# Patient Record
Sex: Female | Born: 2000 | Race: Black or African American | Hispanic: No | Marital: Single | Smoking: Never smoker
Health system: Southern US, Community
[De-identification: ages and names within clinical notes are randomized; demographics above are authoritative.]

---
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
## 2022-01-15 IMAGING — DX DG CHEST 1V PORT
1 series · 1 of 1 positions shown · non-contrast
Comparison: None.

CLINICAL DATA: Chest pain which began at swimming practice, mid
chest pain radiating to left shoulder are

EXAM:
PORTABLE CHEST 1 VIEW

[chest]
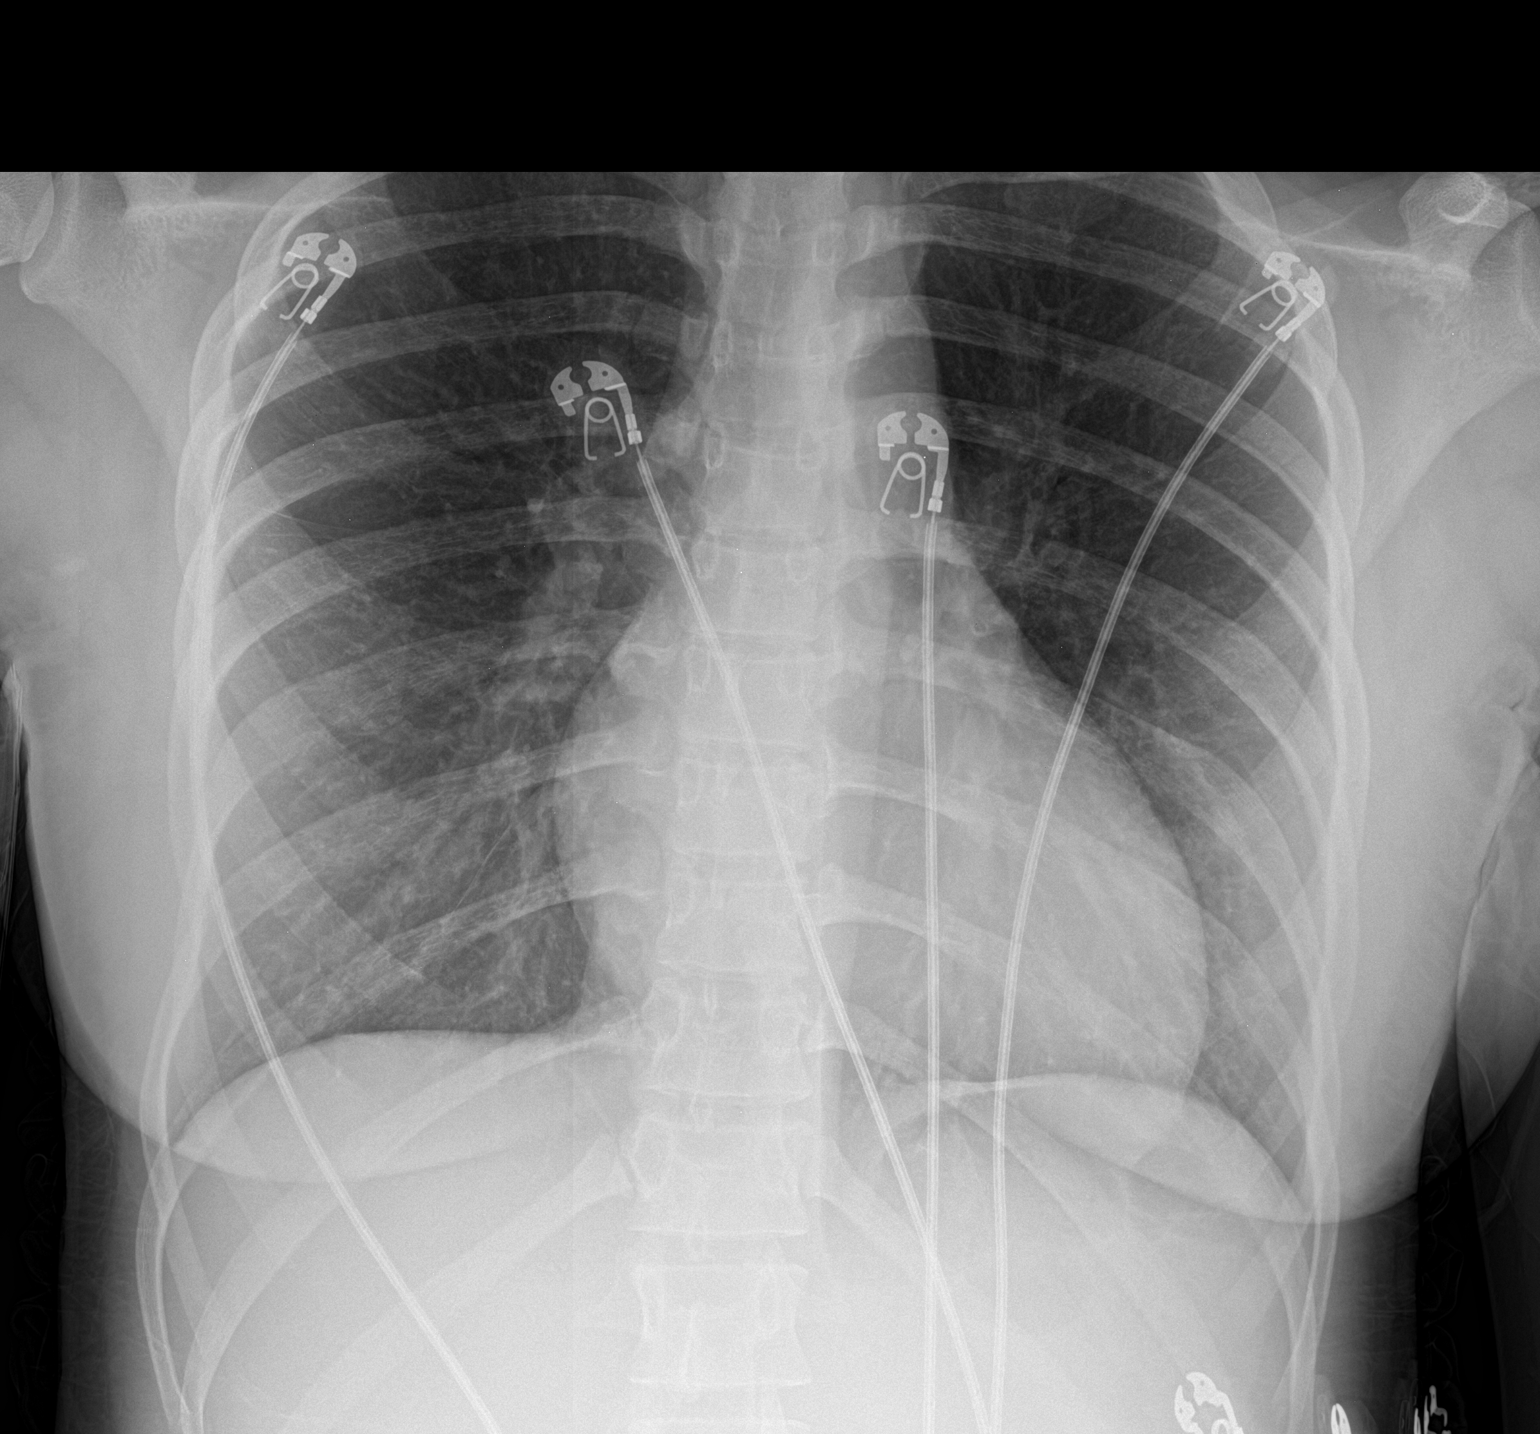

[1 of 1 positions shown; findings below may reference images not displayed]

FINDINGS: No consolidation, features of edema, pneumothorax, or effusion.
Pulmonary vascularity is normally distributed. The cardiomediastinal
contours are unremarkable. No acute osseous or soft tissue
abnormality.
IMPRESSION: No acute cardiopulmonary abnormality.

## 2022-01-31 ENCOUNTER — Encounter: Payer: Self-pay | Admitting: Student

## 2022-03-15 ENCOUNTER — Encounter (HOSPITAL_COMMUNITY): Payer: Self-pay

## 2022-03-15 ENCOUNTER — Ambulatory Visit (HOSPITAL_COMMUNITY)
Admission: RE | Admit: 2022-03-15 | Discharge: 2022-03-15 | Discharge status: Absent without leave | Payer: Commercial Managed Care - HMO | Source: Ambulatory Visit

## 2022-03-15 ENCOUNTER — Ambulatory Visit (HOSPITAL_COMMUNITY)
Admission: EM | Admit: 2022-03-15 | Discharge: 2022-03-15 | Disposition: A | Discharge status: Home / Self Care | Payer: Medicaid Other | Attending: Emergency Medicine | Admitting: Emergency Medicine

## 2022-03-15 DIAGNOSIS — N898 Other specified noninflammatory disorders of vagina: Secondary | ICD-10-CM

## 2022-03-15 DIAGNOSIS — Z113 Encounter for screening for infections with a predominantly sexual mode of transmission: Secondary | ICD-10-CM | POA: Diagnosis not present

## 2022-03-15 NOTE — ED Provider Notes (Signed)
[place]    CSN: [id number] Arrival date & time: 03/15/22  1630     History   Chief Complaint Chief Complaint  Patient presents with   Vaginal Itching    HPI Eliese Shoji is a 21 y.o. female.  Presents with 4-day history of vaginal itching and irritation Denies any change in discharge color or odor No known exposures to STDs Denies rash   Denies urinary symptoms.  No abdominal pain She does not have an OB/GYN  History reviewed. No pertinent past medical history.  Patient Active Problem List   Diagnosis Date Noted   Upper back pain on left side 08/24/2020   Right shoulder pain 10/12/2018   Encounter for oral contraception initial prescription 06/28/2018   Severe menstrual cramps 06/28/2018    History reviewed. No pertinent surgical history.  OB History   No obstetric history on file.      Home Medications    Prior to Admission medications   Medication Sig Start Date End Date Taking? Authorizing Provider  cyclobenzaprine (FLEXERIL) 5 MG tablet Take 1 tablet (5 mg total) by mouth every 8 (eight) hours as needed for muscle spasms. 08/16/20   Alfonso Ellis, MD  ibuprofen (ADVIL) 600 MG tablet Take 1 tablet (600 mg total) by mouth every 8 (eight) hours as needed. Patient not taking: Reported on 07/04/2020 12/20/19   Darr, Edison Nasuti, PA-C  naproxen (NAPROSYN) 500 MG tablet Take 1 tablet (500 mg total) by mouth 2 (two) times daily with a meal. 12/22/19   Meccariello, Bernita Raisin, MD  norgestimate-ethinyl estradiol (Arvin 28) 0.25-35 MG-MCG tablet Take 1 tablet by mouth daily. 07/11/21   Orvis Brill, DO    Family History Family History  Problem Relation Age of Onset   Depression Mother 69       Taking medications    Sickle cell trait Maternal Grandfather     Social History Social History   Tobacco Use   Smoking status: Never   Smokeless tobacco: Never  Vaping Use   Vaping Use: Never used  Substance Use Topics   Alcohol use: Never    Drug use: Never     Allergies   Patient has no known allergies.   Review of Systems Review of Systems Per HPI  Physical Exam Triage Vital Signs ED Triage Vitals [03/15/22 1758]  Enc Vitals Group     BP (!) 149/91     Pulse Rate 79     Resp 12     Temp 98.1 F (36.7 C)     Temp Source Oral     SpO2 100 %     Weight      Height      Head Circumference      Peak Flow      Pain Score 0     Pain Loc      Pain Edu?      Excl. in [place]?    No data found.  Updated Vital Signs BP (!) 149/91 (BP Location: Left Arm)   Pulse 79   Temp 98.1 F (36.7 C) (Oral)   Resp 12   SpO2 100%   Physical Exam Vitals and nursing note reviewed.  Constitutional:      General: She is not in acute distress.    Appearance: Normal appearance.  HENT:     Mouth/Throat:     Pharynx: Oropharynx is clear.  Cardiovascular:     Rate and Rhythm: Normal rate and regular rhythm.  Pulses: Normal pulses.  Pulmonary:     Effort: Pulmonary effort is normal.  Neurological:     Mental Status: She is alert and oriented to person, place, and time.     UC Treatments / Results  Labs (all labs ordered are listed, but only abnormal results are displayed) Labs Reviewed  CERVICOVAGINAL ANCILLARY ONLY    EKG  Radiology No results found.  Procedures Procedures   Medications Ordered in UC Medications - No data to display  Initial Impression / Assessment and Plan / UC Course  I have reviewed the triage vital signs and the nursing notes.  Pertinent labs & imaging results that were available during my care of the patient were reviewed by me and considered in my medical decision making (see chart for details).  Cytology swab pending. Provided women's clinic contact information for follow-up as needed. No other questions or concerns today. Return precautions discussed. Patient agrees to plan  Final Clinical Impressions(s) / UC Diagnoses   Final diagnoses:  Vaginal itching  Screening  examination for STD (sexually transmitted disease)     Discharge Instructions      We will call you if anything on your swab returns positive. Please abstain from sexual intercourse until your results return.  I recommend establish with a women's clinic for further evaluation.     ED Prescriptions   None    PDMP not reviewed this encounter.   Kathrine Haddock 03/15/22 1832

## 2022-03-15 NOTE — Discharge Instructions (Signed)
We will call you if anything on your swab returns positive. Please abstain from sexual intercourse until your results return.  I recommend establish with a women's clinic for further evaluation.

## 2022-03-15 NOTE — ED Triage Notes (Signed)
Pt is here for vaginal irritation x4days

## 2022-03-18 LAB — CERVICOVAGINAL ANCILLARY ONLY
Bacterial Vaginitis (gardnerella): NEGATIVE
Candida Glabrata: NEGATIVE
Candida Vaginitis: POSITIVE — AB
Chlamydia: NEGATIVE
Comment: NEGATIVE
Comment: NEGATIVE
Comment: NEGATIVE
Comment: NEGATIVE
Comment: NEGATIVE
Comment: NORMAL
Neisseria Gonorrhea: NEGATIVE
Trichomonas: NEGATIVE

## 2022-03-19 ENCOUNTER — Telehealth (HOSPITAL_COMMUNITY): Payer: Self-pay | Admitting: Emergency Medicine

## 2022-03-19 MED ORDER — FLUCONAZOLE 150 MG PO TABS: 150.0000 mg | ORAL_TABLET | Freq: Once | ORAL | 0 refills | Status: AC

## 2022-06-18 ENCOUNTER — Other Ambulatory Visit: Payer: Self-pay | Admitting: Student

## 2022-06-26 ENCOUNTER — Telehealth: Payer: Self-pay

## 2022-06-26 MED ORDER — NORGESTIMATE-ETH ESTRADIOL 0.25-35 MG-MCG PO TABS: 1.0000 | ORAL_TABLET | Freq: Every day | ORAL | 11 refills | Status: DC

## 2022-06-26 NOTE — Telephone Encounter (Signed)
Patient calls nurse line in regards to birth control refill.   Medication was sent in, however was set to print.   I have resent this.

## 2022-06-28 ENCOUNTER — Encounter (HOSPITAL_BASED_OUTPATIENT_CLINIC_OR_DEPARTMENT_OTHER): Payer: Self-pay

## 2022-06-28 ENCOUNTER — Other Ambulatory Visit: Payer: Self-pay

## 2022-06-28 ENCOUNTER — Emergency Department (HOSPITAL_BASED_OUTPATIENT_CLINIC_OR_DEPARTMENT_OTHER)
Admission: EM | Admit: 2022-06-28 | Discharge: 2022-06-28 | Disposition: A | Discharge status: Home / Self Care | Payer: Commercial Managed Care - HMO | Attending: Emergency Medicine | Admitting: Emergency Medicine

## 2022-06-28 DIAGNOSIS — M545 Low back pain, unspecified: Secondary | ICD-10-CM | POA: Insufficient documentation

## 2022-06-28 MED ORDER — IBUPROFEN 800 MG PO TABS
800.0000 mg | ORAL_TABLET | Freq: Once | ORAL | Status: AC
  Administered 2022-06-28: 800 mg via ORAL
  Filled 2022-06-28: qty 1

## 2022-06-28 MED ORDER — IBUPROFEN 600 MG PO TABS: 600.0000 mg | ORAL_TABLET | Freq: Four times a day (QID) | ORAL | 0 refills | Status: DC | PRN

## 2022-06-28 MED ORDER — CYCLOBENZAPRINE HCL 10 MG PO TABS: 10.0000 mg | ORAL_TABLET | Freq: Two times a day (BID) | ORAL | 0 refills | Status: DC | PRN

## 2022-06-28 NOTE — Discharge Instructions (Signed)
You were seen today for back pain.  This is likely musculoskeletal.  Take meds as prescribed.  Apply heat or ice as needed for comfort.

## 2022-06-28 NOTE — ED Provider Notes (Signed)
[facility] Provider Note   CSN: [id number] Arrival date & time: 06/28/22  0350     History  Chief Complaint  Patient presents with   Back Pain    Tina Donaldson is a 22 y.o. female.  HPI     This is a 22 year old female who presents with back pain.  Patient reports onset of back pain last night around 9 PM.  She states that she was in the shower.  She states it hurts worse when she bends over.  Pain is achy and nonradiating.  Denies numbness, weakness, tingling in the lower extremities.  Denies any urinary symptoms or fevers.  Does not recall any injury.  Has not taken anything for pain.  Home Medications Prior to Admission medications   Medication Sig Start Date End Date Taking? Authorizing Provider  cyclobenzaprine (FLEXERIL) 10 MG tablet Take 1 tablet (10 mg total) by mouth 2 (two) times daily as needed for muscle spasms. 06/28/22  Yes Maclovia Uher, Barbette Hair, MD  ibuprofen (ADVIL) 600 MG tablet Take 1 tablet (600 mg total) by mouth every 6 (six) hours as needed. 06/28/22  Yes Sayan Aldava, Barbette Hair, MD  naproxen (NAPROSYN) 500 MG tablet Take 1 tablet (500 mg total) by mouth 2 (two) times daily with a meal. 12/22/19   Meccariello, Bernita Raisin, MD  norgestimate-ethinyl estradiol (ESTARYLLA) 0.25-35 MG-MCG tablet Take 1 tablet by mouth daily. 06/26/22   Dameron, Luna Fuse, DO      Allergies    Penicillins    Review of Systems   Review of Systems  Constitutional:  Negative for fever.  Genitourinary:  Negative for dysuria.  Musculoskeletal:  Positive for back pain.  All other systems reviewed and are negative.   Physical Exam Updated Vital Signs BP 134/83   Pulse 91   Temp 98 F (36.7 C)   Resp 18   Ht 1.727 m ('5\' 8"'$ )   Wt 65.8 kg   LMP 06/20/2022   SpO2 98%   BMI 22.05 kg/m  Physical Exam Vitals and nursing note reviewed.  Constitutional:      Appearance: She is well-developed. She is not ill-appearing.  HENT:     Head:  Normocephalic and atraumatic.  Eyes:     Pupils: Pupils are equal, round, and reactive to light.  Cardiovascular:     Rate and Rhythm: Normal rate and regular rhythm.  Pulmonary:     Effort: Pulmonary effort is normal. No respiratory distress.  Abdominal:     Palpations: Abdomen is soft.     Tenderness: There is no right CVA tenderness or left CVA tenderness.  Musculoskeletal:     Cervical back: Neck supple.     Comments: Mild tenderness to palpation over the lower aspect of the lumbar paraspinous muscle region bilaterally, no active spasm noted, negative straight leg raise bilaterally  Skin:    General: Skin is warm and dry.  Neurological:     Mental Status: She is alert and oriented to person, place, and time.     Comments: 5 out of 5 strength bilateral lower extremities, 2+ patellar reflexes bilaterally     ED Results / Procedures / Treatments   Labs (all labs ordered are listed, but only abnormal results are displayed) Labs Reviewed - No data to display  EKG None  Radiology No results found.  Procedures Procedures    Medications Ordered in ED Medications  ibuprofen (ADVIL) tablet 800 mg (800 mg Oral Given 06/28/22 0411)  ED Course/ Medical Decision Making/ A&P                             Medical Decision Making Risk Prescription drug management.   This patient presents to the ED for concern of back pain, this involves an extensive number of treatment options, and is a complaint that carries with it a high risk of complications and morbidity.  I considered the following differential and admission for this acute, potentially life threatening condition.  The differential diagnosis includes skill skeletal pain, sciatica, less likely fracture or infection  MDM:    This is a 22 year old female who presents with back pain.  She is nontoxic and vital signs are reassuring.  No red flags.  No signs or symptoms of cauda equina.  Exam is fairly benign.  Given pain with  bending over, suspect musculoskeletal component.  She not having any urinary symptoms to suggest pyelonephritis and pain is bilateral so I think kidney stone is less likely.  Will treat supportively at this time with anti-inflammatories and muscle relaxants.  (Labs, imaging, consults)  Labs: I Ordered, and personally interpreted labs.  The pertinent results include: None  Imaging Studies ordered: I ordered imaging studies including none I independently visualized and interpreted imaging. I agree with the radiologist interpretation  Additional history obtained from other at bedside.  External records from outside source obtained and reviewed including prior evaluations  Cardiac Monitoring: The patient was not maintained on a cardiac monitor.  If on the cardiac monitor, I personally viewed and interpreted the cardiac monitored which showed an underlying rhythm of: N/A  Reevaluation: After the interventions noted above, I reevaluated the patient and found that they have :stayed the same  Social Determinants of Health:  lives independently  Disposition: Discharge  Co morbidities that complicate the patient evaluation History reviewed. No pertinent past medical history.   Medicines Meds ordered this encounter  Medications   ibuprofen (ADVIL) tablet 800 mg   ibuprofen (ADVIL) 600 MG tablet    Sig: Take 1 tablet (600 mg total) by mouth every 6 (six) hours as needed.    Dispense:  30 tablet    Refill:  0   cyclobenzaprine (FLEXERIL) 10 MG tablet    Sig: Take 1 tablet (10 mg total) by mouth 2 (two) times daily as needed for muscle spasms.    Dispense:  10 tablet    Refill:  0    I have reviewed the patients home medicines and have made adjustments as needed  Problem List / ED Course: Problem List Items Addressed This Visit   None Visit Diagnoses     Lumbosacral pain    -  Primary   Relevant Medications   ibuprofen (ADVIL) tablet 800 mg (Completed) (Start on 06/28/2022  4:15 AM)    ibuprofen (ADVIL) 600 MG tablet   cyclobenzaprine (FLEXERIL) 10 MG tablet          \        Final Clinical Impression(s) / ED Diagnoses Final diagnoses:  Lumbosacral pain    Rx / DC Orders ED Discharge Orders          Ordered    ibuprofen (ADVIL) 600 MG tablet  Every 6 hours PRN        06/28/22 0409    cyclobenzaprine (FLEXERIL) 10 MG tablet  2 times daily PRN        06/28/22 0409  Merryl Hacker, MD 06/28/22 [phone]

## 2022-06-28 NOTE — ED Triage Notes (Signed)
Reports lower back pain with onset 2100 on 06/27/22. Denies injury or urinary s/sx.

## 2022-09-12 ENCOUNTER — Emergency Department (HOSPITAL_COMMUNITY)
Admission: EM | Admit: 2022-09-12 | Discharge: 2022-09-12 | Disposition: A | Discharge status: Home / Self Care | Payer: Commercial Managed Care - HMO | Attending: Emergency Medicine | Admitting: Emergency Medicine

## 2022-09-12 ENCOUNTER — Other Ambulatory Visit: Payer: Self-pay

## 2022-09-12 DIAGNOSIS — B9789 Other viral agents as the cause of diseases classified elsewhere: Secondary | ICD-10-CM | POA: Insufficient documentation

## 2022-09-12 DIAGNOSIS — J029 Acute pharyngitis, unspecified: Secondary | ICD-10-CM

## 2022-09-12 DIAGNOSIS — J028 Acute pharyngitis due to other specified organisms: Secondary | ICD-10-CM | POA: Insufficient documentation

## 2022-09-12 LAB — GROUP A STREP BY PCR: Group A Strep by PCR: NOT DETECTED

## 2022-09-12 MED ORDER — DEXAMETHASONE 4 MG PO TABS
10.0000 mg | ORAL_TABLET | Freq: Once | ORAL | Status: AC
  Administered 2022-09-12: 10 mg via ORAL
  Filled 2022-09-12: qty 3

## 2022-09-12 NOTE — Discharge Instructions (Signed)
You were evaluated in the Emergency Department and after careful evaluation, we did not find any emergent condition requiring admission or further testing in the hospital.  Your exam/testing today is overall reassuring.  Strep test was negative.  Continue Tylenol or Motrin for discomfort.  Please return to the Emergency Department if you experience any worsening of your condition.   Thank you for allowing [place] to be a part of your care.

## 2022-09-12 NOTE — ED Notes (Signed)
Discharge instructions provided by edp were discussed with pt. Pt verbalized understanding with no additional questions at this time.  

## 2022-09-12 NOTE — ED Triage Notes (Signed)
Patient reports sore throat with occasional dry cough for 3 days , denies fever or chills , airway intact / respirations unlabored.

## 2022-09-12 NOTE — ED Provider Notes (Signed)
[facility] Emergency Department Provider Note MRN:  [id number]  Arrival date & time: 09/12/22     Chief Complaint   Sore Throat   History of Present Illness   Tina Donaldson is a 22 y.o. year-old female with no pertinent past medical history presenting to the ED with chief complaint of sore throat.  Cough and sore throat for the past 3 days.  Denies fever.  Review of Systems  A thorough review of systems was obtained and all systems are negative except as noted in the HPI and PMH.   Patient's Health History   No past medical history on file.  No past surgical history on file.  Family History  Problem Relation Age of Onset   Depression Mother 12       Taking medications    Sickle cell trait Maternal Grandfather     Social History   Socioeconomic History   Marital status: Single    Spouse name: Not on file   Number of children: Not on file   Years of education: Not on file   Highest education level: Not on file  Occupational History   Not on file  Tobacco Use   Smoking status: Never   Smokeless tobacco: Never  Vaping Use   Vaping Use: Never used  Substance and Sexual Activity   Alcohol use: Never   Drug use: Never   Sexual activity: Never    Birth control/protection: Abstinence  Other Topics Concern   Not on file  Social History Narrative   Recently moved here from [place], [place] in October 2019. Moved due to homelessness. Family moved here with Patient's half sister who helped them find housing. Now stable housing situation. Mom denies any food insecurities.       HEEADSSS   Home & Environment:    At home, little brother and mom. No contact with dad, but still alive.    Get along with mom and brother.       Education & Employment:    School: [employer], 9th grad.    EC activites: Track in the Spring    Grades- c's    Plans for college - likes science, technology, photography    Not working. Get along with people at  school.       Eating & Exercise:    No body image issues.    DIET: no breakfast, "feels sick". Lunch: sandiwch and chips. Dinner: Salad, fried food, whatever mom makes. Does not like broccoli or brussels sprouts       Acitivites:    Hanging out with friends: school work. Has a good friend at school. Baking - cupcakes. No church. Likes reading - [employer]. Seat belts.       Drugs/Substances:    Drugs and ETOH at parties, but pt does not use. Denies ETOH/Tobacco/vaping/drugs       Sexuality:    Romantically involved now since October (partner lives in [place]). No sexual activity, no history.    Sexual pref: female.       Suicide/Depression:    Depression. PHQ2 - 0       Safety:    Feels safe at home             Social Determinants of Health   Financial Resource Strain: Not on file  Food Insecurity: Unknown (06/28/2018)   Hunger Vital Sign    Worried About Running Out of Food in the Last Year: Patient declined  Ran Out of Food in the Last Year: Patient declined  Transportation Needs: Unknown (06/28/2018)   PRAPARE - Administrator, Civil Service (Medical): Patient declined    Lack of Transportation (Non-Medical): Patient declined  Physical Activity: Not on file  Stress: Not on file  Social Connections: Unknown (06/28/2018)   Social Connection and Isolation Panel [NHANES]    Frequency of Communication with Friends and Family: Not on file    Frequency of Social Gatherings with Friends and Family: Not on file    Attends Religious Services: Never    Active Member of Clubs or Organizations: No    Attends Banker Meetings: Never    Marital Status: Not on file  Intimate Partner Violence: Not At Risk (06/28/2018)   Humiliation, Afraid, Rape, and Kick questionnaire    Fear of Current or Ex-Partner: No    Emotionally Abused: No    Physically Abused: No    Sexually Abused: No     Physical Exam   Vitals:   09/12/22 0032  BP: (!) 143/84  Pulse: (!) 111   Resp: 18  Temp: 98.5 F (36.9 C)  SpO2: 96%    CONSTITUTIONAL: Well-appearing, NAD NEURO/PSYCH:  Alert and oriented x 3, no focal deficits EYES:  eyes equal and reactive ENT/NECK:  no LAD, no JVD CARDIO: Regular rate, well-perfused, normal S1 and S2 PULM:  CTAB no wheezing or rhonchi GI/GU:  non-distended, non-tender MSK/SPINE:  No gross deformities, no edema SKIN:  no rash, atraumatic   *Additional and/or pertinent findings included in MDM below  Diagnostic and Interventional Summary    EKG Interpretation  Date/Time:    Ventricular Rate:    PR Interval:    QRS Duration:   QT Interval:    QTC Calculation:   R Axis:     Text Interpretation:         Labs Reviewed  GROUP A STREP BY PCR    No orders to display    Medications  dexamethasone (DECADRON) tablet 10 mg (has no administration in time range)     Procedures  /  Critical Care Procedures  ED Course and Medical Decision Making  Initial Impression and Ddx Erythema to the posterior oropharynx, DDx includes viral pharyngitis versus strep throat.  No signs of abscess.  Past medical/surgical history that increases complexity of ED encounter: None  Interpretation of Diagnostics Strep negative  Patient Reassessment and Ultimate Disposition/Management     Discharge  Patient management required discussion with the following services or consulting groups:  None  Complexity of Problems Addressed Acute complicated illness or Injury  Additional Data Reviewed and Analyzed Further history obtained from: None  Additional Factors Impacting ED Encounter Risk None  Elmer Sow. Pilar Plate, MD [facility] Emergency [facility] [email or web address]  Final Clinical Impressions(s) / ED Diagnoses     ICD-10-CM   1. Viral pharyngitis  J02.9       ED Discharge Orders     None        Discharge Instructions Discussed with and Provided to Patient:    Discharge Instructions      You  were evaluated in the Emergency Department and after careful evaluation, we did not find any emergent condition requiring admission or further testing in the hospital.  Your exam/testing today is overall reassuring.  Strep test was negative.  Continue Tylenol or Motrin for discomfort.  Please return to the Emergency Department if you experience any worsening of your condition.  Thank you for allowing [place] to be a part of your care.      Sabas Sous, MD 09/12/22 [phone]

## 2022-11-25 ENCOUNTER — Ambulatory Visit (HOSPITAL_COMMUNITY)
Admission: EM | Admit: 2022-11-25 | Discharge: 2022-11-25 | Disposition: A | Discharge status: Home / Self Care | Payer: Commercial Managed Care - HMO | Attending: Family Medicine | Admitting: Family Medicine

## 2022-11-25 ENCOUNTER — Encounter (HOSPITAL_COMMUNITY): Payer: Self-pay

## 2022-11-25 DIAGNOSIS — J029 Acute pharyngitis, unspecified: Secondary | ICD-10-CM | POA: Diagnosis present

## 2022-11-25 DIAGNOSIS — Z1152 Encounter for screening for COVID-19: Secondary | ICD-10-CM | POA: Insufficient documentation

## 2022-11-25 NOTE — ED Triage Notes (Signed)
Pt requesting covid test after going to an event. She needs the test for work.  She also would like to be evaluated for a rash around her mouth x 2-3 days that is itchy. Lotion helps.

## 2022-11-25 NOTE — Discharge Instructions (Addendum)
  You have been swabbed for COVID, and the test will result in the next 24 hours. Our staff will call you if positive. If the COVID test is positive, you should quarantine until you are fever free for 24 hours and you are starting to feel better, and then take added precautions for the next 5 days, such as physical distancing/wearing a mask and good hand hygiene/washing.  You could try Zyrtec for your scratchy throat.

## 2022-11-25 NOTE — ED Provider Notes (Signed)
[facility]    CSN: [id number] Arrival date & time: 11/25/22  1839      History   Chief Complaint Chief Complaint  Patient presents with   covid test    HPI Tina Donaldson is a 22 y.o. female.   HPI Here for some scratchiness in her throat that began yesterday evening.  On July 27 and 28 she was at a festival in concert.  Her work would like her to have a COVID swab due to the possible exposure and her symptoms.  She has not had any fever or nausea or vomiting or diarrhea.  No cough or congestion.  No headache.   last menstrual cycle was July 28.    She is allergic to penicillin History reviewed. No pertinent past medical history.  Patient Active Problem List   Diagnosis Date Noted   Upper back pain on left side 08/24/2020   Right shoulder pain 10/12/2018   Encounter for oral contraception initial prescription 06/28/2018   Severe menstrual cramps 06/28/2018    History reviewed. No pertinent surgical history.  OB History   No obstetric history on file.      Home Medications    Prior to Admission medications   Medication Sig Start Date End Date Taking? Authorizing Provider  norgestimate-ethinyl estradiol (ESTARYLLA) 0.25-35 MG-MCG tablet Take 1 tablet by mouth daily. 06/26/22   Darral Dash, DO    Family History Family History  Problem Relation Age of Onset   Depression Mother 77       Taking medications    Sickle cell trait Maternal Grandfather     Social History Social History   Tobacco Use   Smoking status: Never   Smokeless tobacco: Never  Vaping Use   Vaping status: Never Used  Substance Use Topics   Alcohol use: Never   Drug use: Never     Allergies   Penicillins   Review of Systems Review of Systems   Physical Exam Triage Vital Signs ED Triage Vitals  Encounter Vitals Group     BP 11/25/22 1927 (!) 144/83     Systolic BP Percentile --      Diastolic BP Percentile --      Pulse Rate 11/25/22 1927 (!) 101      Resp 11/25/22 1927 19     Temp 11/25/22 1927 98.5 F (36.9 C)     Temp src --      SpO2 11/25/22 1927 100 %     Weight --      Height --      Head Circumference --      Peak Flow --      Pain Score 11/25/22 1926 0     Pain Loc --      Pain Education --      Exclude from Growth Chart --    No data found.  Updated Vital Signs BP (!) 144/83   Pulse (!) 101   Temp 98.5 F (36.9 C)   Resp 19   LMP 11/23/2022   SpO2 100%   Visual Acuity Right Eye Distance:   Left Eye Distance:   Bilateral Distance:    Right Eye Near:   Left Eye Near:    Bilateral Near:     Physical Exam Vitals reviewed.  Constitutional:      General: She is not in acute distress.    Appearance: She is not toxic-appearing.  HENT:     Nose: Nose normal.     Mouth/Throat:  Mouth: Mucous membranes are moist.     Pharynx: No oropharyngeal exudate or posterior oropharyngeal erythema.  Eyes:     Extraocular Movements: Extraocular movements intact.     Conjunctiva/sclera: Conjunctivae normal.     Pupils: Pupils are equal, round, and reactive to light.  Cardiovascular:     Rate and Rhythm: Normal rate and regular rhythm.     Heart sounds: No murmur heard. Pulmonary:     Effort: Pulmonary effort is normal. No respiratory distress.     Breath sounds: No wheezing, rhonchi or rales.  Chest:     Chest wall: No tenderness.  Musculoskeletal:     Cervical back: Neck supple.  Lymphadenopathy:     Cervical: No cervical adenopathy.  Skin:    Capillary Refill: Capillary refill takes less than 2 seconds.     Coloration: Skin is not jaundiced or pale.  Neurological:     General: No focal deficit present.     Mental Status: She is alert and oriented to person, place, and time.  Psychiatric:        Behavior: Behavior normal.      UC Treatments / Results  Labs (all labs ordered are listed, but only abnormal results are displayed) Labs Reviewed  SARS CORONAVIRUS 2 (TAT 6-24 HRS)     EKG   Radiology No results found.  Procedures Procedures (including critical care time)  Medications Ordered in UC Medications - No data to display  Initial Impression / Assessment and Plan / UC Course  I have reviewed the triage vital signs and the nursing notes.  Pertinent labs & imaging results that were available during my care of the patient were reviewed by me and considered in my medical decision making (see chart for details).        COVID swab is done, and we will notify her if positive.  If positive she will know if she needs to isolate any further.  Work note provided Final Clinical Impressions(s) / UC Diagnoses   Final diagnoses:  Sore throat     Discharge Instructions       You have been swabbed for COVID, and the test will result in the next 24 hours. Our staff will call you if positive. If the COVID test is positive, you should quarantine until you are fever free for 24 hours and you are starting to feel better, and then take added precautions for the next 5 days, such as physical distancing/wearing a mask and good hand hygiene/washing.  You could try Zyrtec for your scratchy throat.     ED Prescriptions   None    PDMP not reviewed this encounter.   Zenia Resides, MD 11/25/22 [phone]

## 2023-02-03 ENCOUNTER — Encounter (HOSPITAL_COMMUNITY): Payer: Self-pay | Admitting: Emergency Medicine

## 2023-02-03 ENCOUNTER — Telehealth (HOSPITAL_COMMUNITY): Payer: Self-pay

## 2023-02-03 ENCOUNTER — Ambulatory Visit (HOSPITAL_COMMUNITY)
Admission: EM | Admit: 2023-02-03 | Discharge: 2023-02-03 | Disposition: A | Discharge status: Home / Self Care | Payer: Managed Care, Other (non HMO) | Attending: Family Medicine | Admitting: Family Medicine

## 2023-02-03 DIAGNOSIS — N3090 Cystitis, unspecified without hematuria: Secondary | ICD-10-CM | POA: Diagnosis present

## 2023-02-03 LAB — POCT URINALYSIS DIP (MANUAL ENTRY)
Bilirubin, UA: NEGATIVE
Glucose, UA: NEGATIVE mg/dL
Ketones, POC UA: NEGATIVE mg/dL
Nitrite, UA: NEGATIVE
Protein Ur, POC: 100 mg/dL — AB
Spec Grav, UA: >=1.03 — AB (ref 1.010–1.025)
Urobilinogen, UA: 0.2 U/dL
pH, UA: 6 (ref 5.0–8.0)

## 2023-02-03 MED ORDER — PHENAZOPYRIDINE HCL 100 MG PO TABS: 100.0000 mg | ORAL_TABLET | Freq: Three times a day (TID) | ORAL | 0 refills | Status: DC | PRN

## 2023-02-03 MED ORDER — NITROFURANTOIN MONOHYD MACRO 100 MG PO CAPS: 100.0000 mg | ORAL_CAPSULE | Freq: Two times a day (BID) | ORAL | 0 refills | Status: DC

## 2023-02-03 NOTE — ED Triage Notes (Signed)
Pt c/o urine being dark color, dysuria that started today.

## 2023-02-03 NOTE — ED Provider Notes (Signed)
[facility]    CSN: [id number] Arrival date & time: 02/03/23  1633      History   Chief Complaint Chief Complaint  Patient presents with   Dysuria    HPI Tina Donaldson is a 22 y.o. female.    Dysuria Here for dysuria and dark urine that began yesterday. Worsened today.  No f/c/n/v.  Urine has been a little cloudy and there was some blood.  LMP 9/21.  History reviewed. No pertinent past medical history.  Patient Active Problem List   Diagnosis Date Noted   Upper back pain on left side 08/24/2020   Right shoulder pain 10/12/2018   Encounter for oral contraception initial prescription 06/28/2018   Severe menstrual cramps 06/28/2018    History reviewed. No pertinent surgical history.  OB History   No obstetric history on file.      Home Medications    Prior to Admission medications   Medication Sig Start Date End Date Taking? Authorizing Provider  nitrofurantoin, macrocrystal-monohydrate, (MACROBID) 100 MG capsule Take 1 capsule (100 mg total) by mouth 2 (two) times daily. 02/03/23  Yes Zenia Resides, MD  phenazopyridine (PYRIDIUM) 100 MG tablet Take 1 tablet (100 mg total) by mouth 3 (three) times daily as needed (urinary pain). 02/03/23  Yes Zenia Resides, MD  norgestimate-ethinyl estradiol (ESTARYLLA) 0.25-35 MG-MCG tablet Take 1 tablet by mouth daily. 06/26/22   Darral Dash, DO    Family History Family History  Problem Relation Age of Onset   Depression Mother 35       Taking medications    Sickle cell trait Maternal Grandfather     Social History Social History   Tobacco Use   Smoking status: Never   Smokeless tobacco: Never  Vaping Use   Vaping status: Never Used  Substance Use Topics   Alcohol use: Never   Drug use: Never     Allergies   Penicillins   Review of Systems Review of Systems  Genitourinary:  Positive for dysuria.     Physical Exam Triage Vital Signs ED Triage Vitals  Encounter Vitals  Group     BP 02/03/23 1653 121/83     Systolic BP Percentile --      Diastolic BP Percentile --      Pulse Rate 02/03/23 1653 76     Resp 02/03/23 1653 15     Temp 02/03/23 1653 98 F (36.7 C)     Temp Source 02/03/23 1653 Oral     SpO2 02/03/23 1653 97 %     Weight --      Height --      Head Circumference --      Peak Flow --      Pain Score 02/03/23 1652 0     Pain Loc --      Pain Education --      Exclude from Growth Chart --    No data found.  Updated Vital Signs BP 121/83 (BP Location: Left Arm)   Pulse 76   Temp 98 F (36.7 C) (Oral)   Resp 15   LMP 01/17/2023   SpO2 97%   Visual Acuity Right Eye Distance:   Left Eye Distance:   Bilateral Distance:    Right Eye Near:   Left Eye Near:    Bilateral Near:     Physical Exam Vitals reviewed.  Constitutional:      General: She is not in acute distress.    Appearance: She is not  toxic-appearing.  HENT:     Mouth/Throat:     Mouth: Mucous membranes are moist.  Eyes:     Extraocular Movements: Extraocular movements intact.     Conjunctiva/sclera: Conjunctivae normal.     Pupils: Pupils are equal, round, and reactive to light.  Cardiovascular:     Rate and Rhythm: Normal rate and regular rhythm.     Heart sounds: No murmur heard. Pulmonary:     Effort: Pulmonary effort is normal.     Breath sounds: Normal breath sounds.  Abdominal:     Palpations: Abdomen is soft.     Tenderness: There is no abdominal tenderness.  Musculoskeletal:     Cervical back: Neck supple.  Lymphadenopathy:     Cervical: No cervical adenopathy.  Skin:    Coloration: Skin is not jaundiced or pale.  Neurological:     General: No focal deficit present.     Mental Status: She is alert and oriented to person, place, and time.  Psychiatric:        Behavior: Behavior normal.      UC Treatments / Results  Labs (all labs ordered are listed, but only abnormal results are displayed) Labs Reviewed  POCT URINALYSIS DIP (MANUAL  ENTRY) - Abnormal; Notable for the following components:      Result Value   Clarity, UA cloudy (*)    Spec Grav, UA >=1.030 (*)    Blood, UA moderate (*)    Protein Ur, POC =100 (*)    Leukocytes, UA Small (1+) (*)    All other components within normal limits  URINE CULTURE    EKG   Radiology No results found.  Procedures Procedures (including critical care time)  Medications Ordered in UC Medications - No data to display  Initial Impression / Assessment and Plan / UC Course  I have reviewed the triage vital signs and the nursing notes.  Pertinent labs & imaging results that were available during my care of the patient were reviewed by me and considered in my medical decision making (see chart for details).       UA shows moderate RBC, small amount of leuks, neg nitrites. Nitrofurantoin is sent in to treat the cystitis and urine culture is sent.  Staff will notify her if she needs to change her antibiotic.  Pyridium is also sent in for the urinary pain Final Clinical Impressions(s) / UC Diagnoses   Final diagnoses:  Cystitis     Discharge Instructions      The urine had some white blood cells and red blood cells, most likely consistent with a bladder infection.  Take nitrofurantoin 100 mg--1 capsule 2 times daily for 5 days  Take Pyridium/phenazopyridine 100 mg--1 tablet 3 times daily as needed for urinary pain.  This medication usually makes the urine orange  Keep drinking plenty of water.  Urine culture is sent, and our staff will call you if it looks like your antibiotic needs to be changed     ED Prescriptions     Medication Sig Dispense Auth. Provider   nitrofurantoin, macrocrystal-monohydrate, (MACROBID) 100 MG capsule Take 1 capsule (100 mg total) by mouth 2 (two) times daily. 10 capsule Zenia Resides, MD   phenazopyridine (PYRIDIUM) 100 MG tablet Take 1 tablet (100 mg total) by mouth 3 (three) times daily as needed (urinary pain). 10 tablet  Marlinda Mike Janace Aris, MD      PDMP not reviewed this encounter.   Zenia Resides, MD 02/03/23 [phone]

## 2023-02-03 NOTE — Discharge Instructions (Signed)
The urine had some white blood cells and red blood cells, most likely consistent with a bladder infection.  Take nitrofurantoin 100 mg--1 capsule 2 times daily for 5 days  Take Pyridium/phenazopyridine 100 mg--1 tablet 3 times daily as needed for urinary pain.  This medication usually makes the urine orange  Keep drinking plenty of water.  Urine culture is sent, and our staff will call you if it looks like your antibiotic needs to be changed

## 2023-02-05 LAB — URINE CULTURE: Culture: 20000 — AB

## 2023-06-25 ENCOUNTER — Other Ambulatory Visit: Payer: Self-pay

## 2023-06-25 NOTE — Telephone Encounter (Signed)
 Patient calls nurse line requesting a refill on OCPs.  Patient advised she has not been seen since 06/2021 and she will need to make an apt.   Apt made for 3/11 with PCP.   She is asking for #1 pack of OCPs to get her to apt.   Will forward to PCP.

## 2023-07-07 ENCOUNTER — Ambulatory Visit (INDEPENDENT_AMBULATORY_CARE_PROVIDER_SITE_OTHER): Payer: Managed Care, Other (non HMO) | Admitting: Student

## 2023-07-07 VITALS — BP 117/80 | HR 82 | Ht 68.0 in | Wt 172.8 lb

## 2023-07-07 DIAGNOSIS — Z114 Encounter for screening for human immunodeficiency virus [HIV]: Secondary | ICD-10-CM

## 2023-07-07 DIAGNOSIS — Z1159 Encounter for screening for other viral diseases: Secondary | ICD-10-CM

## 2023-07-07 DIAGNOSIS — Z131 Encounter for screening for diabetes mellitus: Secondary | ICD-10-CM

## 2023-07-07 NOTE — Patient Instructions (Signed)
 It was great seeing you today.  As we discussed, - You are healthy, I have no concerns. Keep up the good work at school and work! - We will start cervical cancer screening in 1 year with a pap smear - Blood work at next visit   If you have any questions or concerns, please feel free to call the clinic.   Have a wonderful day,  Dr. Darral Dash [facility] [phone]

## 2023-07-07 NOTE — Progress Notes (Signed)
    SUBJECTIVE:   Chief compliant/HPI: annual examination   History of Present Illness   The patient, a 23 year old who recently moved out of her parent's house, is currently working and attending school. She presents for an annual checkup, her first since moving out. She reports no specific health concerns. She is currently taking birth control pills with no reported issues. She confirms being sexually active with men, using condoms, and has no concerns for pregnancy. She has no history of depression or anxiety. no other significant past medical history is reported.       OBJECTIVE:   BP 117/80   Pulse 82   Ht 5' 8 (1.727 m)   Wt 172 lb 12.8 oz (78.4 kg)   SpO2 97%   BMI 26.27 kg/m   Physical Exam   General: NAD, well appearing Cardiac: RRR Neuro: A&O Respiratory: normal WOB on RA. No wheezing or crackles on auscultation, good lung sounds throughout Extremities: Moving all 4 extremities equally   ASSESSMENT/PLAN:    Annual Examination  See AVS for age appropriate recommendations.   PHQ score , reviewed and discussed. Blood pressure reviewed and at goal.  Asked about intimate partner violence and patient reports none.  The patient currently uses oral combined OCPs for contraception. Folate recommended as appropriate, minimum of 400 mcg per day.    Considered the following items based upon USPSTF recommendations: HIV testing: ordered for next visit Hepatitis C: ordered for next visit Hepatitis B: ordered for next visit Syphilis if at high risk: ordered for next visit GC/CT  declined today.   Cervical cancer screening: not indicated as not yet age 11.  Immunizations none   Follow up in 1  year or sooner if indicated.    Kingston Shawgo, DO [facility]

## 2023-07-31 ENCOUNTER — Other Ambulatory Visit: Payer: Self-pay

## 2023-08-03 MED ORDER — NORGESTIMATE-ETH ESTRADIOL 0.25-35 MG-MCG PO TABS: 1.0000 | ORAL_TABLET | Freq: Every day | ORAL | 11 refills | Status: AC

## 2023-10-06 ENCOUNTER — Encounter: Payer: Self-pay | Admitting: *Deleted
